# Patient Record
Sex: Female | Born: 1935
Health system: Southern US, Community
[De-identification: ages and names within clinical notes are randomized; demographics above are authoritative.]

## PROBLEM LIST (undated history)

## (undated) DIAGNOSIS — I10 Essential (primary) hypertension: Secondary | ICD-10-CM

## (undated) HISTORY — PX: APPENDECTOMY: SHX54

## (undated) HISTORY — PX: TONSILLECTOMY: SUR1361

## (undated) HISTORY — PX: TUBAL LIGATION: SHX77

## (undated) HISTORY — DX: Essential (primary) hypertension: I10

---

## 2005-02-22 ENCOUNTER — Ambulatory Visit: Payer: Self-pay | Admitting: Internal Medicine

## 2006-04-11 ENCOUNTER — Ambulatory Visit: Payer: Self-pay | Admitting: Internal Medicine

## 2007-04-30 ENCOUNTER — Ambulatory Visit: Payer: Self-pay | Admitting: Unknown Physician Specialty

## 2007-05-08 ENCOUNTER — Ambulatory Visit: Payer: Self-pay | Admitting: Internal Medicine

## 2008-05-11 ENCOUNTER — Ambulatory Visit: Payer: Self-pay | Admitting: Internal Medicine

## 2009-05-13 ENCOUNTER — Ambulatory Visit: Payer: Self-pay | Admitting: Internal Medicine

## 2010-05-24 ENCOUNTER — Ambulatory Visit: Payer: Self-pay | Admitting: Internal Medicine

## 2011-07-09 ENCOUNTER — Ambulatory Visit: Payer: Self-pay | Admitting: Internal Medicine

## 2012-07-24 ENCOUNTER — Ambulatory Visit: Payer: Self-pay | Admitting: Internal Medicine

## 2012-08-22 ENCOUNTER — Ambulatory Visit: Payer: Self-pay | Admitting: Unknown Physician Specialty

## 2012-12-31 HISTORY — PX: REPLACEMENT TOTAL KNEE: SUR1224

## 2013-08-11 ENCOUNTER — Ambulatory Visit: Payer: Self-pay | Admitting: Internal Medicine

## 2013-10-14 ENCOUNTER — Ambulatory Visit: Payer: Self-pay | Admitting: General Practice

## 2013-10-14 LAB — URINALYSIS, COMPLETE
Glucose,UR: NEGATIVE mg/dL (ref 0–75)
Hyaline Cast: 9
Nitrite: NEGATIVE
Ph: 5 (ref 4.5–8.0)
Protein: NEGATIVE
RBC,UR: 3 /HPF (ref 0–5)
Squamous Epithelial: 2
WBC UR: 40 /HPF (ref 0–5)

## 2013-10-14 LAB — SEDIMENTATION RATE: Erythrocyte Sed Rate: 17 mm/hr (ref 0–30)

## 2013-10-14 LAB — BASIC METABOLIC PANEL
Anion Gap: 4 — ABNORMAL LOW (ref 7–16)
EGFR (Non-African Amer.): 38 — ABNORMAL LOW
Glucose: 96 mg/dL (ref 65–99)
Sodium: 140 mmol/L (ref 136–145)

## 2013-10-14 LAB — CBC
MCH: 28.7 pg (ref 26.0–34.0)
MCHC: 33.5 g/dL (ref 32.0–36.0)
MCV: 86 fL (ref 80–100)
RBC: 4.34 10*6/uL (ref 3.80–5.20)
RDW: 14.3 % (ref 11.5–14.5)

## 2013-10-14 LAB — APTT: Activated PTT: 26.9 secs (ref 23.6–35.9)

## 2013-10-14 LAB — PROTIME-INR: INR: 0.9

## 2013-10-28 ENCOUNTER — Inpatient Hospital Stay: Payer: Self-pay | Admitting: General Practice

## 2013-10-29 LAB — BASIC METABOLIC PANEL
BUN: 25 mg/dL — ABNORMAL HIGH (ref 7–18)
Calcium, Total: 8.7 mg/dL (ref 8.5–10.1)
Chloride: 105 mmol/L (ref 98–107)
Co2: 30 mmol/L (ref 21–32)
EGFR (Non-African Amer.): 46 — ABNORMAL LOW
Osmolality: 280 (ref 275–301)
Potassium: 4.4 mmol/L (ref 3.5–5.1)

## 2013-10-29 LAB — HEMOGLOBIN: HGB: 10.8 g/dL — ABNORMAL LOW (ref 12.0–16.0)

## 2013-10-29 LAB — PLATELET COUNT: Platelet: 165 10*3/uL (ref 150–440)

## 2013-10-30 LAB — HEMOGLOBIN: HGB: 10.6 g/dL — ABNORMAL LOW (ref 12.0–16.0)

## 2013-10-30 LAB — BASIC METABOLIC PANEL
Anion Gap: 3 — ABNORMAL LOW (ref 7–16)
BUN: 26 mg/dL — ABNORMAL HIGH (ref 7–18)
Chloride: 108 mmol/L — ABNORMAL HIGH (ref 98–107)
Co2: 29 mmol/L (ref 21–32)
Creatinine: 1.28 mg/dL (ref 0.60–1.30)
EGFR (African American): 47 — ABNORMAL LOW
EGFR (Non-African Amer.): 41 — ABNORMAL LOW
Osmolality: 284 (ref 275–301)
Potassium: 4 mmol/L (ref 3.5–5.1)
Sodium: 140 mmol/L (ref 136–145)

## 2013-10-30 LAB — PLATELET COUNT: Platelet: 174 10*3/uL (ref 150–440)

## 2014-01-25 ENCOUNTER — Ambulatory Visit: Payer: Self-pay | Admitting: General Practice

## 2014-08-30 ENCOUNTER — Ambulatory Visit: Payer: Self-pay | Admitting: Internal Medicine

## 2015-04-22 NOTE — Op Note (Signed)
PATIENT NAME:  Kristen Jackson, Kristen Jackson MR#:  [id number] DATE OF BIRTH:  05-Sep-1935  DATE OF PROCEDURE:  10/28/2013  PREOPERATIVE DIAGNOSIS: Degenerative arthrosis of the left knee.   POSTOPERATIVE DIAGNOSIS: Degenerative arthrosis of the left knee.   PROCEDURE PERFORMED: Left total knee arthroplasty using computer-assisted navigation.   SURGEON: Francesco SorJames  Hooten, M.D.   ASSISTANT:  Van ClinesJon Wolfe, PA (required to maintain retraction throughout the procedure).   ANESTHESIA: Spinal.   ESTIMATED BLOOD LOSS: 50 mL.   FLUIDS REPLACED: 2000 mL of crystalloid.   TOURNIQUET TIME: 88 minutes.   DRAINS: Two medium drains to reinfusion system.   SOFT TISSUE RELEASES: Anterior cruciate ligament, posterior cruciate ligament, deep medial collateral ligament and patellofemoral ligament.   IMPLANTS UTILIZED: DePuy PFC Sigma size 2.5 posterior stabilized femoral component (cemented), size 2.5 MBT tibial component (cemented), 32 mm 3 peg oval dome patella (cemented), and a 10 mm stabilized rotating platform polyethylene insert. Gentamicin bone cement was utilized.   INDICATIONS FOR SURGERY: The patient is a 80 year old obese female who has been seen for complaints of progressive left knee pain. X-rays demonstrated severe degenerative changes in tricompartmental fashion with relative varus deformity. After discussion of the risks and benefits of surgical intervention, the patient expressed understanding of the risks and benefits and agreed with plans for surgical intervention.   PROCEDURE IN DETAIL: The patient was brought to the operating room and, after adequate spinal anesthesia was achieved, a tourniquet was placed on the patient's upper left thigh. The patient's left knee and leg were cleaned and prepped with alcohol and DuraPrep and draped in the usual sterile fashion. A "timeout" was performed as per usual protocol. The left lower extremity was exsanguinated using an Esmarch, and the tourniquet was inflated to 300  mmHg. An anterior longitudinal incision was made followed by a standard mid vastus approach. The patella was subluxed laterally and the patellofemoral ligament was incised. Deep fibers of the medial collateral ligament were elevated in a subperiosteal fashion off the medial  flare of the tibia so as to maintain a continuous soft tissue sleeve. Inspection of the knee demonstrated severe degenerative changes in a tricompartmental fashion with most significant changes noted to the medial compartment. Prominent osteophytes were debrided using a rongeur. Anterior and posterior cruciate ligaments were excised. Two 4.0 mm Schanz pins were inserted into the femur and into the tibia for attachment of the ray of trackers used for computer-assisted navigation. Hip center was identified using a circumduction technique. Distal landmarks were mapped using the computer. The distal femur and proximal tibia were mapped using the computer. Distal femoral cutting guide was positioned using computer-assisted navigation so as to achieve a 5 degrees distal valgus cut. Cut was performed and verified using the computer. Distal femur was sized and it was felt that a size 2.5 femoral component was appropriate. A size 2.5 cutting guide was positioned and the anterior cut was performed and verified using the computer. This was followed by completion of the posterior and chamfer cuts. Femoral cutting guide for the central box was then positioned and the central box cut was performed. Attention was then directed to the proximal tibia. Medial and lateral menisci were excised. The extramedullary tibial cutting guide was positioned using computer-assisted navigation so as to achieve a 0 degrees varus valgus alignment and 0 degrees posterior slope. Cut was performed and verified using the computer. The proximal tibia was sized and it was felt that a size 2.5 tibial tray was appropriate. Tibial and femoral trials  were inserted followed by insertion  of a 10 mm polyethylene insert. Good medial and lateral soft tissue balance was appreciated both in full extension and in flexion. Finally, the patella was cut and prepared so as to accommodate a 32 mm 3 peg oval dome patella. Patellar trial was placed and the knee was placed through a range of motion with excellent patellar tracking appreciated.   The femoral trial was removed. Central post hole for the tibial component was reamed followed by insertion of a keel punch. Tibial trial was then removed. Cut surfaces of bone were irrigated with copious amounts of normal saline with antibiotic solution using pulsatile lavage and then suctioned dry. Polymethyl methacrylate cement with gentamicin was prepared in the usual fashion using a vacuum mixer. Cement was applied to the cut surface of the proximal tibia as well as along the undersurface of a size 2.5 MBT tibial component. The tibial component was positioned and impacted into place. Excess cement was removed using Personal assistant. Cement was then applied to the cut surface of the femur as well as on the posterior flanges of a size 2.5 posterior stabilized femoral component. Femoral component was positioned and impacted into place. Excess cement was removed using Personal assistant. A 10 mm polyethylene trial was inserted and the knee was brought in full extension with steady axial compression applied. Finally, cement was applied to the backside of a 32 mm 3 peg oval dome patella, and the patellar component was positioned and patellar clamp applied. Excess cement was removed using Personal assistant.   After adequate curing of cement, the tourniquet was deflated after total tourniquet time of 88 minutes. Hemostasis was achieved using electrocautery. The knee was irrigated with copious amounts of normal saline with antibiotic solution using pulsatile lavage and then suctioned dry. The knee was inspected for any residual cement debris. 20 mL of 1.3% Exparel in 40 mL of  normal saline was injected along the posterior recess, medial and lateral gutters, and along the arthrotomy site. A 10 mm stabilized rotating platform polyethylene insert was inserted and the knee was placed through a range of motion with excellent patellar tracking appreciated and excellent mediolateral soft tissue balancing noted. Two medium drains were placed in the wound bed and brought out through a separate stab incision to be attached to a reinfusion system. The medial parapatellar portion of the incision was reapproximated using interrupted sutures of #1 Vicryl. The subcutaneous tissue was approximated in layers using first running subcuticular suture of #0 Vicryl followed by interrupted sutures of #0 Vicryl. Final layer was closed using interrupted sutures of 2-0 Vicryl. 30 mL of 0.25% Marcaine with epinephrine was injected along the incision site into the subcutaneous tissue. Skin was then closed with skin staples. A sterile dressing was applied followed by application of Polar Care.   The patient tolerated the procedure well. She was transported to the recovery room in stable condition.   ____________________________ Illene Labrador. Angie Fava., MD jph:dp D: 10/28/2013 11:24:32 ET T: 10/28/2013 11:38:40 ET JOB#: [id number]  cc: Fayrene Fearing P. Angie Fava., MD, <Dictator> JAMES P Angie Fava MD ELECTRONICALLY SIGNED 11/01/2013 11:09

## 2015-04-22 NOTE — Discharge Summary (Signed)
PATIENT NAME:  Kristen Jackson, Jaki C MR#:  [id number] DATE OF BIRTH:  [birth year]  DATE OF ADMISSION:  10/28/2013 DATE OF DISCHARGE:  10/31/2013  ADMITTING DIAGNOSIS: Degenerative arthrosis of the left knee.   DISCHARGE DIAGNOSIS: Degenerative arthrosis of the right knee.   HISTORY OF PRESENT ILLNESS: The patient is a pleasant 80 year old female who has [facility] for progression of left knee pain. She has reported somewhere around a 2 to 3 year history of left knee pain. She had appreciated some increase in her left knee pain recently. Her pain was noted to be aggravated with weight-bearing activities. She had localized most of the pain along the medial aspect of the knee. She had significant start-up stiffness as well as difficulty with arising from a sitting position or going up and down stairs. She reported some nighttime discomfort. The patient had not seen any significant improvement in her condition despite nonsteroidal anti-inflammatory medications as well as intra-articular cortisone injections. She states that the pain had progressed to the point that it was significantly interfering with her activities of daily living. [facility] showed narrowing of the medial cartilage space with associated varus alignment. She was noted to have osteophyte as well as subchondral sclerosis. Degenerative changes to the patellofemoral articulation were noted. After discussion of the risks and benefits of surgical intervention, the patient expressed her understanding of the risks and benefits and agreed for plans for surgical intervention.   PROCEDURE: Left total knee arthroplasty using computer-assisted navigation.   ANESTHESIA: Spinal.   SOFT TISSUE RELEASE: Anterior cruciate ligament, posterior cruciate ligament, deep medial collateral ligaments, as well as patellofemoral ligament.   IMPLANTS UTILIZED:  DePuy PFC Sigma size 2.5 posterior stabilized femoral component  (cemented), size 2.5 MBT tibial component (cemented), 32 mm three pegged oval dome patella (cemented), and a 10 mm stabilized rotating platform polyethylene insert. Gentamicin bone cement was utilized.   HOSPITAL COURSE: The patient tolerated the procedure very well. She had no complications. She was then taken to the PAC-U where she was stabilized and then transferred to the orthopedic floor. She began receiving anticoagulation therapy of Lovenox 30 mg subcutaneous every 12 hours per anesthesia and pharmacy protocol. She was fitted with TED stockings bilaterally. These were allowed to be removed 1 hour per 8 hour shift. The left one was applied on day 2 following removal of the Hemovac and dressing change. The patient was also fitted with the AV-I compression foot pumps bilaterally set at 80 mmHg. Her calves have been nontender. There has been no evidence of any DVTs. Heels were elevated off the bed using rolled towels. Negative Homans sign.   The patient has denied any chest pain or shortness of breath. Vital signs have been stable. She has been afebrile. Hemodynamically she was stable. No transfusions were given other than the Autovac transfusions given the first 6 hours postoperatively.   Physical therapy was initiated on day 1 for gait training and transfers. She has done very well. Upon being discharged, she was ambulating greater than 200 feet. She was able go up and down 4 sets of steps. She was independent with bed to chair transfers. Occupational therapy was also initiated on day 1 for activities of daily living and assistive devices. Overall it has been an unremarkable hospital course and she has done very well.   The patient's IV, Foley and Hemovac were discontinued on day 2 along with a dressing change. The wound was free of any drainage or  signs of infection. Polar Care was reapplied to the surgical leg, maintaining a temperature of 40 to 50 degrees Fahrenheit.   DISPOSITION: The patient is  being discharged to home in improved stable condition.   DISCHARGE INSTRUCTIONS:  She will continue weight-bearing as tolerated. Continue using a rolling walker until cleared by physical therapy to go to a quad cane. She will receive home health patient. Continue with TED stockings bilaterally. These are allowed to be removed at night but are to be worn during the day. Continue with Polar Care, maintaining temperature of 40 to 50 degrees Fahrenheit. Recommend that she wear this around-the-clock 24 hours a day until seen in the office in 2 weeks. Elevate the heels off the bed. Recommend that she continue with her incentive spirometer q.1 hour while awake and encourage her to do cough, deep breathing every 2 hours while awake. She is placed on a regular diet. She was instructed on wound care.  She is not to get the wound wet until staples are removed in 2 weeks. She is to call the clinic if any fevers of 101.5 or greater or excessive bleeding. She has a follow-up appointment on 11/13 at 8:30.  DRUG ALLERGIES:  No known drug allergies.   MEDICATIONS:  The patient was given a prescription for oxycodone 5 to 10 mg every 4 to 6 hours p.r.n. for pain, Ultram 50 to 100 mg every 4 to 6 hours p.r.n. for pain and Lovenox 40 mg subcutaneously daily for 14 days and then discontinue and begin taking one 81 mg enteric-coated aspirin per day unless contraindicated.   PAST MEDICAL HISTORY:  Hypertension, adenoma, chronic anemia.     ____________________________ Van Clines, PA jrw:dp D: 11/03/2013 07:49:46 ET T: 11/03/2013 08:18:21 ET JOB#: [id number]  cc: Van Clines, PA, <Dictator> Simar Pothier PA ELECTRONICALLY SIGNED 11/04/2013 7:59

## 2015-08-12 ENCOUNTER — Other Ambulatory Visit: Payer: Self-pay | Admitting: Internal Medicine

## 2015-08-12 DIAGNOSIS — Z1231 Encounter for screening mammogram for malignant neoplasm of breast: Secondary | ICD-10-CM

## 2015-09-01 ENCOUNTER — Ambulatory Visit
Admission: RE | Admit: 2015-09-01 | Discharge: 2015-09-01 | Disposition: A | Discharge status: Home / Self Care | Payer: PPO | Source: Ambulatory Visit | Attending: Internal Medicine | Admitting: Internal Medicine

## 2015-09-01 DIAGNOSIS — Z1231 Encounter for screening mammogram for malignant neoplasm of breast: Secondary | ICD-10-CM | POA: Diagnosis present

## 2016-02-08 DIAGNOSIS — Z0000 Encounter for general adult medical examination without abnormal findings: Secondary | ICD-10-CM | POA: Diagnosis not present

## 2016-02-15 DIAGNOSIS — E039 Hypothyroidism, unspecified: Secondary | ICD-10-CM | POA: Diagnosis not present

## 2016-02-15 DIAGNOSIS — Z79899 Other long term (current) drug therapy: Secondary | ICD-10-CM | POA: Diagnosis not present

## 2016-02-15 DIAGNOSIS — I10 Essential (primary) hypertension: Secondary | ICD-10-CM | POA: Diagnosis not present

## 2016-08-08 DIAGNOSIS — Z79899 Other long term (current) drug therapy: Secondary | ICD-10-CM | POA: Diagnosis not present

## 2016-08-08 DIAGNOSIS — E039 Hypothyroidism, unspecified: Secondary | ICD-10-CM | POA: Diagnosis not present

## 2016-08-15 DIAGNOSIS — Z0000 Encounter for general adult medical examination without abnormal findings: Secondary | ICD-10-CM | POA: Diagnosis not present

## 2016-08-15 DIAGNOSIS — E538 Deficiency of other specified B group vitamins: Secondary | ICD-10-CM | POA: Diagnosis not present

## 2016-08-21 ENCOUNTER — Other Ambulatory Visit: Payer: Self-pay | Admitting: Internal Medicine

## 2016-08-21 DIAGNOSIS — Z1231 Encounter for screening mammogram for malignant neoplasm of breast: Secondary | ICD-10-CM

## 2016-09-06 ENCOUNTER — Ambulatory Visit: Payer: PPO

## 2016-09-17 DIAGNOSIS — H353131 Nonexudative age-related macular degeneration, bilateral, early dry stage: Secondary | ICD-10-CM | POA: Diagnosis not present

## 2016-09-17 DIAGNOSIS — H40019 Open angle with borderline findings, low risk, unspecified eye: Secondary | ICD-10-CM | POA: Diagnosis not present

## 2016-09-27 ENCOUNTER — Other Ambulatory Visit: Payer: Self-pay | Admitting: Internal Medicine

## 2016-09-27 ENCOUNTER — Ambulatory Visit
Admission: RE | Admit: 2016-09-27 | Discharge: 2016-09-27 | Disposition: A | Discharge status: Home / Self Care | Payer: PPO | Source: Ambulatory Visit | Attending: Internal Medicine | Admitting: Internal Medicine

## 2016-09-27 DIAGNOSIS — Z1231 Encounter for screening mammogram for malignant neoplasm of breast: Secondary | ICD-10-CM

## 2017-02-11 DIAGNOSIS — E538 Deficiency of other specified B group vitamins: Secondary | ICD-10-CM | POA: Diagnosis not present

## 2017-02-11 DIAGNOSIS — Z0000 Encounter for general adult medical examination without abnormal findings: Secondary | ICD-10-CM | POA: Diagnosis not present

## 2017-02-18 DIAGNOSIS — Z0000 Encounter for general adult medical examination without abnormal findings: Secondary | ICD-10-CM | POA: Diagnosis not present

## 2017-02-18 DIAGNOSIS — R32 Unspecified urinary incontinence: Secondary | ICD-10-CM | POA: Diagnosis not present

## 2017-02-18 DIAGNOSIS — I10 Essential (primary) hypertension: Secondary | ICD-10-CM | POA: Diagnosis not present

## 2017-02-18 DIAGNOSIS — E538 Deficiency of other specified B group vitamins: Secondary | ICD-10-CM | POA: Diagnosis not present

## 2017-03-04 ENCOUNTER — Encounter: Payer: Self-pay | Admitting: Urology

## 2017-03-04 ENCOUNTER — Ambulatory Visit: Payer: PPO | Admitting: Urology

## 2017-03-04 VITALS — BP 168/72 | HR 52 | Ht 62.0 in | Wt 185.5 lb

## 2017-03-04 DIAGNOSIS — R32 Unspecified urinary incontinence: Secondary | ICD-10-CM | POA: Diagnosis not present

## 2017-03-04 DIAGNOSIS — N3946 Mixed incontinence: Secondary | ICD-10-CM

## 2017-03-04 LAB — BLADDER SCAN AMB NON-IMAGING: Scan Result: 293

## 2017-03-04 NOTE — Progress Notes (Signed)
03/04/2017 9:13 AM   Kristen Jackson 03/03/1935 [id number]  Referring provider: Danella PentonMark F Miller, MD [phone] [facility] [place], [place] [id number]  Chief Complaint  Patient presents with  . New Patient (Initial Visit)    urination incontinence, possible bladder prolapse     HPI: I was consulted to assess the patient's urinary incontinence and prolapse. For many months she has worsening incontinence. She describes urgency incontinence especially when she goes from a sitting to standing position. She may at times leak with coughing sneezing bending and lifting but other times does not. She does not have bedwetting. She wears 1-3 pads per day and sometimes they can be soaken  She feels vaginal bulging especially with a bowel movement. She will reduce the prolapse to try to empty better and to avoid post voiding incontinence.  She voids every 2-3 hours and has no nocturia. She reports are reasonable to good flow  She has not had a hysterectomy. Stool softeners regulate her bowel movements  She denies a history kidney stones previous GU surgery and recent urinary tract infections. She has no neurologic issues.  Modifying factors: There are no other modifying factors  Associated signs and symptoms: There are no other associated signs and symptoms Aggravating and relieving factors: There are no other aggravating or relieving factors Severity: Moderate Duration: Persistent     PMH: Past Medical History:  Diagnosis Date  . Hypertension     Surgical History: Past Surgical History:  Procedure Laterality Date  . APPENDECTOMY    . REPLACEMENT TOTAL KNEE  2014  . TONSILLECTOMY    . TUBAL LIGATION      Home Medications:  Allergies as of 03/04/2017   No Known Allergies     Medication List       Accurate as of 03/04/17  9:13 AM. Always use your most recent med list.          atenolol 100 MG tablet Commonly known as:  TENORMIN Take 100 mg by  mouth daily.   cyanocobalamin 2000 MCG tablet Take 2,000 mcg by mouth daily.   lisinopril-hydrochlorothiazide 20-25 MG tablet Commonly known as:  PRINZIDE,ZESTORETIC Take 1 tablet by mouth daily.   Lutein 15-0.7 MG Caps Take by mouth.   OMEGA-3 1450 PO Take by mouth.   traMADol 50 MG tablet Commonly known as:  ULTRAM Take 50 mg by mouth every 6 (six) hours as needed.   Vitamin D3 2000 units Tabs Take by mouth.       Allergies: No Known Allergies  Family History: Family History  Problem Relation Age of Onset  . Breast cancer Sister [id number]  . Hematuria Neg Hx   . Renal cancer Neg Hx     Social History:  has no tobacco, alcohol, and drug history on file.  ROS: UROLOGY Frequent Urination?: No Hard to postpone urination?: Yes Burning/pain with urination?: No Get up at night to urinate?: No Leakage of urine?: No Urine stream starts and stops?: No Trouble starting stream?: No Do you have to strain to urinate?: No Blood in urine?: No Urinary tract infection?: No Sexually transmitted disease?: No Injury to kidneys or bladder?: No Painful intercourse?: No Weak stream?: No Currently pregnant?: No Vaginal bleeding?: No Last menstrual period?: No  Gastrointestinal Nausea?: No Vomiting?: No Indigestion/heartburn?: No Diarrhea?: No Constipation?: No  Constitutional Fever: No Night sweats?: Yes Weight loss?: No Fatigue?: No  Skin Skin rash/lesions?: No Itching?: No  Eyes Blurred vision?: No Double vision?: No  Ears/Nose/Throat Sore throat?: No Sinus problems?: No  Hematologic/Lymphatic Swollen glands?: No Easy bruising?: No  Cardiovascular Leg swelling?: Yes Chest pain?: No  Respiratory Cough?: No Shortness of breath?: No  Endocrine Excessive thirst?: No  Musculoskeletal Back pain?: No Joint pain?: No  Neurological Headaches?: No Dizziness?: No  Psychologic Depression?: No Anxiety?: No  Physical Exam: BP (!) 168/72   Pulse (!)  52   Ht 5\' 2"  (1.575 m)   Wt 84.1 kg (185 lb 8 oz)   BMI 33.93 kg/m   Constitutional:  Alert and oriented, No acute distress. HEENT: [place] AT, moist mucus membranes.  Trachea midline, no masses. Cardiovascular: No clubbing, cyanosis, or edema. Respiratory: Normal respiratory effort, no increased work of breathing. GI: Abdomen is soft, nontender, nondistended, no abdominal masses GU: On pelvic examination the patient had a grade 3 cystocele with central defect. The uterus descended from 8 or 9 cm to approximately 4 cm. She had a diffuse grade 2 rectocele. She had hypermobility of the bladder neck and a negative cough test but could not cough hard. Skin: No rashes, bruises or suspicious lesions. Lymph: No cervical or inguinal adenopathy. Neurologic: Grossly intact, no focal deficits, moving all 4 extremities. Psychiatric: Normal mood and affect.  Laboratory Data: Lab Results  Component Value Date   WBC 7.5 10/14/2013   HGB 10.6 (L) 10/30/2013   HCT 37.2 10/14/2013   MCV 86 10/14/2013   PLT 174 10/30/2013    Lab Results  Component Value Date   CREATININE 1.28 10/30/2013    No results found for: PSA  No results found for: TESTOSTERONE  No results found for: HGBA1C  Urinalysis    Component Value Date/Time   COLORURINE Yellow 10/14/2013 0924   APPEARANCEUR Clear 10/14/2013 0924   LABSPEC 1.008 10/14/2013 0924   PHURINE 5.0 10/14/2013 0924   GLUCOSEU Negative 10/14/2013 0924   HGBUR Negative 10/14/2013 0924   BILIRUBINUR Negative 10/14/2013 0924   KETONESUR Negative 10/14/2013 0924   PROTEINUR Negative 10/14/2013 0924   NITRITE Negative 10/14/2013 0924   LEUKOCYTESUR 2+ 10/14/2013 0924    Pertinent Imaging: none  Assessment & Plan:  The patient has mixed incontinence but primarily urgency incontinence. She has symptomatic prolapse. The patient has significant prolapse with cystocele and rectocele and loss of uterine support. If she ever had surgery she likely would best  benefit from a transvaginal hysterectomy with vault suspension and cystocele repair and graft. She probably would benefit from a rectocele repair but based upon her age she may or may not need one for certain. The role of a pessary and urodynamics was discussed and a picture was on. The patient she chose watchful waiting on all accounts especially based upon her age and relative comorbidities she will let me know and I will order urodynamics as she changes her mind   1. Urinary incontinence in female 2. Urgency incontinence 3. Cystocele 4. Rectocele  - Urinalysis, Complete - Bladder Scan (Post Void Residual) in office   No Follow-up on file.  Martina Sinner, MD  [facility] [address], [address] [place], [place] [address] [phone]

## 2017-03-04 NOTE — Addendum Note (Signed)
Addended by: Lonna CobbUSSELL, Kayd Launer L on: 03/04/2017 09:38 AM   Modules accepted: Orders

## 2017-04-19 DIAGNOSIS — E538 Deficiency of other specified B group vitamins: Secondary | ICD-10-CM | POA: Diagnosis not present

## 2017-08-20 DIAGNOSIS — I10 Essential (primary) hypertension: Secondary | ICD-10-CM | POA: Diagnosis not present

## 2017-08-20 DIAGNOSIS — E538 Deficiency of other specified B group vitamins: Secondary | ICD-10-CM | POA: Diagnosis not present

## 2017-08-27 DIAGNOSIS — E538 Deficiency of other specified B group vitamins: Secondary | ICD-10-CM | POA: Diagnosis not present

## 2017-08-27 DIAGNOSIS — M7062 Trochanteric bursitis, left hip: Secondary | ICD-10-CM | POA: Diagnosis not present

## 2017-08-27 DIAGNOSIS — E039 Hypothyroidism, unspecified: Secondary | ICD-10-CM | POA: Diagnosis not present

## 2017-08-27 DIAGNOSIS — Z0000 Encounter for general adult medical examination without abnormal findings: Secondary | ICD-10-CM | POA: Diagnosis not present

## 2017-09-18 ENCOUNTER — Other Ambulatory Visit: Payer: Self-pay | Admitting: Internal Medicine

## 2017-09-18 DIAGNOSIS — Z1231 Encounter for screening mammogram for malignant neoplasm of breast: Secondary | ICD-10-CM

## 2017-10-01 ENCOUNTER — Ambulatory Visit
Admission: RE | Admit: 2017-10-01 | Discharge: 2017-10-01 | Disposition: A | Discharge status: Home / Self Care | Payer: PPO | Source: Ambulatory Visit | Attending: Internal Medicine | Admitting: Internal Medicine

## 2017-10-01 DIAGNOSIS — Z1231 Encounter for screening mammogram for malignant neoplasm of breast: Secondary | ICD-10-CM | POA: Diagnosis not present

## 2017-10-16 DIAGNOSIS — H40019 Open angle with borderline findings, low risk, unspecified eye: Secondary | ICD-10-CM | POA: Diagnosis not present

## 2017-10-16 DIAGNOSIS — H401130 Primary open-angle glaucoma, bilateral, stage unspecified: Secondary | ICD-10-CM | POA: Diagnosis not present

## 2017-10-16 DIAGNOSIS — H353131 Nonexudative age-related macular degeneration, bilateral, early dry stage: Secondary | ICD-10-CM | POA: Diagnosis not present

## 2018-02-17 DIAGNOSIS — E538 Deficiency of other specified B group vitamins: Secondary | ICD-10-CM | POA: Diagnosis not present

## 2018-02-17 DIAGNOSIS — E039 Hypothyroidism, unspecified: Secondary | ICD-10-CM | POA: Diagnosis not present

## 2018-02-24 DIAGNOSIS — Z79899 Other long term (current) drug therapy: Secondary | ICD-10-CM | POA: Diagnosis not present

## 2018-02-24 DIAGNOSIS — Z0000 Encounter for general adult medical examination without abnormal findings: Secondary | ICD-10-CM | POA: Diagnosis not present

## 2018-02-24 DIAGNOSIS — E039 Hypothyroidism, unspecified: Secondary | ICD-10-CM | POA: Diagnosis not present

## 2018-02-24 DIAGNOSIS — E538 Deficiency of other specified B group vitamins: Secondary | ICD-10-CM | POA: Diagnosis not present

## 2018-09-17 ENCOUNTER — Other Ambulatory Visit: Payer: Self-pay | Admitting: Internal Medicine

## 2018-09-17 DIAGNOSIS — Z1231 Encounter for screening mammogram for malignant neoplasm of breast: Secondary | ICD-10-CM

## 2018-10-02 ENCOUNTER — Ambulatory Visit
Admission: RE | Admit: 2018-10-02 | Discharge: 2018-10-02 | Disposition: A | Discharge status: Home / Self Care | Payer: PPO | Source: Ambulatory Visit | Attending: Internal Medicine | Admitting: Internal Medicine

## 2018-10-02 DIAGNOSIS — Z1231 Encounter for screening mammogram for malignant neoplasm of breast: Secondary | ICD-10-CM | POA: Diagnosis not present

## 2018-10-23 DIAGNOSIS — E538 Deficiency of other specified B group vitamins: Secondary | ICD-10-CM | POA: Diagnosis not present

## 2018-10-23 DIAGNOSIS — E039 Hypothyroidism, unspecified: Secondary | ICD-10-CM | POA: Diagnosis not present

## 2018-10-23 DIAGNOSIS — Z79899 Other long term (current) drug therapy: Secondary | ICD-10-CM | POA: Diagnosis not present

## 2018-10-30 DIAGNOSIS — Z0000 Encounter for general adult medical examination without abnormal findings: Secondary | ICD-10-CM | POA: Diagnosis not present

## 2018-10-30 DIAGNOSIS — E538 Deficiency of other specified B group vitamins: Secondary | ICD-10-CM | POA: Diagnosis not present

## 2018-10-30 DIAGNOSIS — E039 Hypothyroidism, unspecified: Secondary | ICD-10-CM | POA: Diagnosis not present

## 2018-11-07 DIAGNOSIS — M5432 Sciatica, left side: Secondary | ICD-10-CM | POA: Diagnosis not present

## 2018-11-07 DIAGNOSIS — M9905 Segmental and somatic dysfunction of pelvic region: Secondary | ICD-10-CM | POA: Diagnosis not present

## 2018-11-07 DIAGNOSIS — M9903 Segmental and somatic dysfunction of lumbar region: Secondary | ICD-10-CM | POA: Diagnosis not present

## 2018-11-07 DIAGNOSIS — M4306 Spondylolysis, lumbar region: Secondary | ICD-10-CM | POA: Diagnosis not present

## 2018-11-10 DIAGNOSIS — M9905 Segmental and somatic dysfunction of pelvic region: Secondary | ICD-10-CM | POA: Diagnosis not present

## 2018-11-10 DIAGNOSIS — M5432 Sciatica, left side: Secondary | ICD-10-CM | POA: Diagnosis not present

## 2018-11-10 DIAGNOSIS — M4306 Spondylolysis, lumbar region: Secondary | ICD-10-CM | POA: Diagnosis not present

## 2018-11-10 DIAGNOSIS — M9903 Segmental and somatic dysfunction of lumbar region: Secondary | ICD-10-CM | POA: Diagnosis not present

## 2018-11-12 DIAGNOSIS — M9905 Segmental and somatic dysfunction of pelvic region: Secondary | ICD-10-CM | POA: Diagnosis not present

## 2018-11-12 DIAGNOSIS — M4306 Spondylolysis, lumbar region: Secondary | ICD-10-CM | POA: Diagnosis not present

## 2018-11-12 DIAGNOSIS — M5432 Sciatica, left side: Secondary | ICD-10-CM | POA: Diagnosis not present

## 2018-11-12 DIAGNOSIS — M9903 Segmental and somatic dysfunction of lumbar region: Secondary | ICD-10-CM | POA: Diagnosis not present

## 2018-11-14 DIAGNOSIS — M4306 Spondylolysis, lumbar region: Secondary | ICD-10-CM | POA: Diagnosis not present

## 2018-11-14 DIAGNOSIS — M9903 Segmental and somatic dysfunction of lumbar region: Secondary | ICD-10-CM | POA: Diagnosis not present

## 2018-11-14 DIAGNOSIS — M5432 Sciatica, left side: Secondary | ICD-10-CM | POA: Diagnosis not present

## 2018-11-14 DIAGNOSIS — M9905 Segmental and somatic dysfunction of pelvic region: Secondary | ICD-10-CM | POA: Diagnosis not present

## 2018-11-17 DIAGNOSIS — M9903 Segmental and somatic dysfunction of lumbar region: Secondary | ICD-10-CM | POA: Diagnosis not present

## 2018-11-17 DIAGNOSIS — M5432 Sciatica, left side: Secondary | ICD-10-CM | POA: Diagnosis not present

## 2018-11-17 DIAGNOSIS — M9905 Segmental and somatic dysfunction of pelvic region: Secondary | ICD-10-CM | POA: Diagnosis not present

## 2018-11-17 DIAGNOSIS — M4306 Spondylolysis, lumbar region: Secondary | ICD-10-CM | POA: Diagnosis not present

## 2018-11-19 DIAGNOSIS — M9905 Segmental and somatic dysfunction of pelvic region: Secondary | ICD-10-CM | POA: Diagnosis not present

## 2018-11-19 DIAGNOSIS — M9903 Segmental and somatic dysfunction of lumbar region: Secondary | ICD-10-CM | POA: Diagnosis not present

## 2018-11-19 DIAGNOSIS — M4306 Spondylolysis, lumbar region: Secondary | ICD-10-CM | POA: Diagnosis not present

## 2018-11-19 DIAGNOSIS — M5432 Sciatica, left side: Secondary | ICD-10-CM | POA: Diagnosis not present

## 2018-11-21 DIAGNOSIS — M9905 Segmental and somatic dysfunction of pelvic region: Secondary | ICD-10-CM | POA: Diagnosis not present

## 2018-11-21 DIAGNOSIS — M4306 Spondylolysis, lumbar region: Secondary | ICD-10-CM | POA: Diagnosis not present

## 2018-11-21 DIAGNOSIS — M5432 Sciatica, left side: Secondary | ICD-10-CM | POA: Diagnosis not present

## 2018-11-21 DIAGNOSIS — M9903 Segmental and somatic dysfunction of lumbar region: Secondary | ICD-10-CM | POA: Diagnosis not present

## 2018-11-24 DIAGNOSIS — M9903 Segmental and somatic dysfunction of lumbar region: Secondary | ICD-10-CM | POA: Diagnosis not present

## 2018-11-24 DIAGNOSIS — M5432 Sciatica, left side: Secondary | ICD-10-CM | POA: Diagnosis not present

## 2018-11-24 DIAGNOSIS — M4306 Spondylolysis, lumbar region: Secondary | ICD-10-CM | POA: Diagnosis not present

## 2018-11-24 DIAGNOSIS — M9905 Segmental and somatic dysfunction of pelvic region: Secondary | ICD-10-CM | POA: Diagnosis not present

## 2018-11-25 DIAGNOSIS — M9905 Segmental and somatic dysfunction of pelvic region: Secondary | ICD-10-CM | POA: Diagnosis not present

## 2018-11-25 DIAGNOSIS — M4306 Spondylolysis, lumbar region: Secondary | ICD-10-CM | POA: Diagnosis not present

## 2018-11-25 DIAGNOSIS — M9903 Segmental and somatic dysfunction of lumbar region: Secondary | ICD-10-CM | POA: Diagnosis not present

## 2018-11-25 DIAGNOSIS — M5432 Sciatica, left side: Secondary | ICD-10-CM | POA: Diagnosis not present

## 2018-11-26 DIAGNOSIS — M9903 Segmental and somatic dysfunction of lumbar region: Secondary | ICD-10-CM | POA: Diagnosis not present

## 2018-11-26 DIAGNOSIS — M5432 Sciatica, left side: Secondary | ICD-10-CM | POA: Diagnosis not present

## 2018-11-26 DIAGNOSIS — M4306 Spondylolysis, lumbar region: Secondary | ICD-10-CM | POA: Diagnosis not present

## 2018-11-26 DIAGNOSIS — M9905 Segmental and somatic dysfunction of pelvic region: Secondary | ICD-10-CM | POA: Diagnosis not present

## 2018-12-02 DIAGNOSIS — M9903 Segmental and somatic dysfunction of lumbar region: Secondary | ICD-10-CM | POA: Diagnosis not present

## 2018-12-02 DIAGNOSIS — M5432 Sciatica, left side: Secondary | ICD-10-CM | POA: Diagnosis not present

## 2018-12-02 DIAGNOSIS — M9905 Segmental and somatic dysfunction of pelvic region: Secondary | ICD-10-CM | POA: Diagnosis not present

## 2018-12-02 DIAGNOSIS — M4306 Spondylolysis, lumbar region: Secondary | ICD-10-CM | POA: Diagnosis not present

## 2018-12-03 DIAGNOSIS — M5432 Sciatica, left side: Secondary | ICD-10-CM | POA: Diagnosis not present

## 2018-12-03 DIAGNOSIS — M9905 Segmental and somatic dysfunction of pelvic region: Secondary | ICD-10-CM | POA: Diagnosis not present

## 2018-12-03 DIAGNOSIS — M9903 Segmental and somatic dysfunction of lumbar region: Secondary | ICD-10-CM | POA: Diagnosis not present

## 2018-12-03 DIAGNOSIS — M4306 Spondylolysis, lumbar region: Secondary | ICD-10-CM | POA: Diagnosis not present

## 2018-12-05 DIAGNOSIS — M4306 Spondylolysis, lumbar region: Secondary | ICD-10-CM | POA: Diagnosis not present

## 2018-12-05 DIAGNOSIS — M9905 Segmental and somatic dysfunction of pelvic region: Secondary | ICD-10-CM | POA: Diagnosis not present

## 2018-12-05 DIAGNOSIS — M5432 Sciatica, left side: Secondary | ICD-10-CM | POA: Diagnosis not present

## 2018-12-05 DIAGNOSIS — M9903 Segmental and somatic dysfunction of lumbar region: Secondary | ICD-10-CM | POA: Diagnosis not present

## 2018-12-08 DIAGNOSIS — M5432 Sciatica, left side: Secondary | ICD-10-CM | POA: Diagnosis not present

## 2018-12-08 DIAGNOSIS — M4306 Spondylolysis, lumbar region: Secondary | ICD-10-CM | POA: Diagnosis not present

## 2018-12-08 DIAGNOSIS — M9905 Segmental and somatic dysfunction of pelvic region: Secondary | ICD-10-CM | POA: Diagnosis not present

## 2018-12-08 DIAGNOSIS — M9903 Segmental and somatic dysfunction of lumbar region: Secondary | ICD-10-CM | POA: Diagnosis not present

## 2018-12-10 DIAGNOSIS — M9903 Segmental and somatic dysfunction of lumbar region: Secondary | ICD-10-CM | POA: Diagnosis not present

## 2018-12-10 DIAGNOSIS — M5432 Sciatica, left side: Secondary | ICD-10-CM | POA: Diagnosis not present

## 2018-12-10 DIAGNOSIS — M4306 Spondylolysis, lumbar region: Secondary | ICD-10-CM | POA: Diagnosis not present

## 2018-12-10 DIAGNOSIS — M9905 Segmental and somatic dysfunction of pelvic region: Secondary | ICD-10-CM | POA: Diagnosis not present

## 2018-12-15 DIAGNOSIS — M5432 Sciatica, left side: Secondary | ICD-10-CM | POA: Diagnosis not present

## 2018-12-15 DIAGNOSIS — M9903 Segmental and somatic dysfunction of lumbar region: Secondary | ICD-10-CM | POA: Diagnosis not present

## 2018-12-15 DIAGNOSIS — M4306 Spondylolysis, lumbar region: Secondary | ICD-10-CM | POA: Diagnosis not present

## 2018-12-15 DIAGNOSIS — M9905 Segmental and somatic dysfunction of pelvic region: Secondary | ICD-10-CM | POA: Diagnosis not present

## 2018-12-17 DIAGNOSIS — M9905 Segmental and somatic dysfunction of pelvic region: Secondary | ICD-10-CM | POA: Diagnosis not present

## 2018-12-17 DIAGNOSIS — M9903 Segmental and somatic dysfunction of lumbar region: Secondary | ICD-10-CM | POA: Diagnosis not present

## 2018-12-17 DIAGNOSIS — M5432 Sciatica, left side: Secondary | ICD-10-CM | POA: Diagnosis not present

## 2018-12-17 DIAGNOSIS — M4306 Spondylolysis, lumbar region: Secondary | ICD-10-CM | POA: Diagnosis not present

## 2018-12-22 DIAGNOSIS — M9905 Segmental and somatic dysfunction of pelvic region: Secondary | ICD-10-CM | POA: Diagnosis not present

## 2018-12-22 DIAGNOSIS — M5432 Sciatica, left side: Secondary | ICD-10-CM | POA: Diagnosis not present

## 2018-12-22 DIAGNOSIS — M4306 Spondylolysis, lumbar region: Secondary | ICD-10-CM | POA: Diagnosis not present

## 2018-12-22 DIAGNOSIS — M9903 Segmental and somatic dysfunction of lumbar region: Secondary | ICD-10-CM | POA: Diagnosis not present

## 2018-12-29 DIAGNOSIS — M5432 Sciatica, left side: Secondary | ICD-10-CM | POA: Diagnosis not present

## 2018-12-29 DIAGNOSIS — M9903 Segmental and somatic dysfunction of lumbar region: Secondary | ICD-10-CM | POA: Diagnosis not present

## 2018-12-29 DIAGNOSIS — M4306 Spondylolysis, lumbar region: Secondary | ICD-10-CM | POA: Diagnosis not present

## 2018-12-29 DIAGNOSIS — M9905 Segmental and somatic dysfunction of pelvic region: Secondary | ICD-10-CM | POA: Diagnosis not present

## 2019-04-28 DIAGNOSIS — E039 Hypothyroidism, unspecified: Secondary | ICD-10-CM | POA: Diagnosis not present

## 2019-04-28 DIAGNOSIS — E538 Deficiency of other specified B group vitamins: Secondary | ICD-10-CM | POA: Diagnosis not present

## 2019-05-05 DIAGNOSIS — M5136 Other intervertebral disc degeneration, lumbar region: Secondary | ICD-10-CM | POA: Diagnosis not present

## 2019-05-05 DIAGNOSIS — Z0000 Encounter for general adult medical examination without abnormal findings: Secondary | ICD-10-CM | POA: Diagnosis not present

## 2019-05-05 DIAGNOSIS — E039 Hypothyroidism, unspecified: Secondary | ICD-10-CM | POA: Diagnosis not present

## 2019-05-05 DIAGNOSIS — E538 Deficiency of other specified B group vitamins: Secondary | ICD-10-CM | POA: Diagnosis not present

## 2019-09-08 ENCOUNTER — Other Ambulatory Visit: Payer: Self-pay | Admitting: Internal Medicine

## 2019-09-08 DIAGNOSIS — Z1231 Encounter for screening mammogram for malignant neoplasm of breast: Secondary | ICD-10-CM

## 2019-10-12 ENCOUNTER — Ambulatory Visit
Admission: RE | Admit: 2019-10-12 | Discharge: 2019-10-12 | Disposition: A | Discharge status: Home / Self Care | Payer: PPO | Source: Ambulatory Visit | Attending: Internal Medicine | Admitting: Internal Medicine

## 2019-10-12 DIAGNOSIS — Z1231 Encounter for screening mammogram for malignant neoplasm of breast: Secondary | ICD-10-CM | POA: Insufficient documentation

## 2019-10-20 DIAGNOSIS — W010XXA Fall on same level from slipping, tripping and stumbling without subsequent striking against object, initial encounter: Secondary | ICD-10-CM | POA: Diagnosis not present

## 2019-10-20 DIAGNOSIS — M25512 Pain in left shoulder: Secondary | ICD-10-CM | POA: Diagnosis not present

## 2019-10-20 DIAGNOSIS — S52532A Colles' fracture of left radius, initial encounter for closed fracture: Secondary | ICD-10-CM | POA: Diagnosis not present

## 2019-10-20 DIAGNOSIS — S52502A Unspecified fracture of the lower end of left radius, initial encounter for closed fracture: Secondary | ICD-10-CM | POA: Diagnosis not present

## 2019-10-20 DIAGNOSIS — S6992XA Unspecified injury of left wrist, hand and finger(s), initial encounter: Secondary | ICD-10-CM | POA: Diagnosis not present

## 2019-10-20 DIAGNOSIS — S4992XA Unspecified injury of left shoulder and upper arm, initial encounter: Secondary | ICD-10-CM | POA: Diagnosis not present

## 2019-10-23 DIAGNOSIS — S52572A Other intraarticular fracture of lower end of left radius, initial encounter for closed fracture: Secondary | ICD-10-CM | POA: Diagnosis not present

## 2019-10-23 DIAGNOSIS — M25532 Pain in left wrist: Secondary | ICD-10-CM | POA: Diagnosis not present

## 2019-10-23 DIAGNOSIS — M25512 Pain in left shoulder: Secondary | ICD-10-CM | POA: Diagnosis not present

## 2019-10-27 DIAGNOSIS — Z6841 Body Mass Index (BMI) 40.0 and over, adult: Secondary | ICD-10-CM | POA: Diagnosis not present

## 2019-10-27 DIAGNOSIS — S52572A Other intraarticular fracture of lower end of left radius, initial encounter for closed fracture: Secondary | ICD-10-CM | POA: Diagnosis not present

## 2019-10-27 DIAGNOSIS — M25532 Pain in left wrist: Secondary | ICD-10-CM | POA: Diagnosis not present

## 2019-11-04 DIAGNOSIS — E538 Deficiency of other specified B group vitamins: Secondary | ICD-10-CM | POA: Diagnosis not present

## 2019-11-04 DIAGNOSIS — E039 Hypothyroidism, unspecified: Secondary | ICD-10-CM | POA: Diagnosis not present

## 2019-11-10 DIAGNOSIS — S52572A Other intraarticular fracture of lower end of left radius, initial encounter for closed fracture: Secondary | ICD-10-CM | POA: Diagnosis not present

## 2019-11-10 DIAGNOSIS — M25532 Pain in left wrist: Secondary | ICD-10-CM | POA: Diagnosis not present

## 2019-11-11 DIAGNOSIS — E538 Deficiency of other specified B group vitamins: Secondary | ICD-10-CM | POA: Diagnosis not present

## 2019-11-11 DIAGNOSIS — Z0000 Encounter for general adult medical examination without abnormal findings: Secondary | ICD-10-CM | POA: Diagnosis not present

## 2019-11-11 DIAGNOSIS — E039 Hypothyroidism, unspecified: Secondary | ICD-10-CM | POA: Diagnosis not present

## 2019-11-20 DIAGNOSIS — S52572A Other intraarticular fracture of lower end of left radius, initial encounter for closed fracture: Secondary | ICD-10-CM | POA: Diagnosis not present

## 2019-11-20 DIAGNOSIS — M25532 Pain in left wrist: Secondary | ICD-10-CM | POA: Diagnosis not present

## 2019-12-04 DIAGNOSIS — M25532 Pain in left wrist: Secondary | ICD-10-CM | POA: Diagnosis not present

## 2019-12-04 DIAGNOSIS — S52502D Unspecified fracture of the lower end of left radius, subsequent encounter for closed fracture with routine healing: Secondary | ICD-10-CM | POA: Diagnosis not present

## 2020-01-29 DIAGNOSIS — H353131 Nonexudative age-related macular degeneration, bilateral, early dry stage: Secondary | ICD-10-CM | POA: Diagnosis not present

## 2020-02-16 DIAGNOSIS — Z780 Asymptomatic menopausal state: Secondary | ICD-10-CM | POA: Diagnosis not present

## 2020-03-24 ENCOUNTER — Ambulatory Visit: Payer: PPO | Attending: Internal Medicine

## 2020-03-24 DIAGNOSIS — Z23 Encounter for immunization: Secondary | ICD-10-CM

## 2020-03-24 NOTE — Progress Notes (Signed)
   Covid-19 Vaccination Clinic  Name:  Kristen Jackson    MRN: [id number] DOB: 1935/01/17  03/24/2020  [name] was observed post Covid-19 immunization for 15 minutes without incident. She was provided with Vaccine Information Sheet and instruction to access the V-Safe system.   [name] was instructed to call 911 with any severe reactions post vaccine: Marland Kitchen Difficulty breathing  . Swelling of face and throat  . A fast heartbeat  . A bad rash all over body  . Dizziness and weakness   Immunizations Administered    Name Date Dose VIS Date Route   Pfizer COVID-19 Vaccine 03/24/2020  1:27 PM 0.3 mL 12/11/2019 Intramuscular   Manufacturer: [employer], [employer]   Lot: [id number]   NDC: [address]-9

## 2020-04-18 ENCOUNTER — Ambulatory Visit: Payer: PPO | Attending: Internal Medicine

## 2020-04-18 DIAGNOSIS — Z23 Encounter for immunization: Secondary | ICD-10-CM

## 2020-04-18 NOTE — Progress Notes (Signed)
   Covid-19 Vaccination Clinic  Name:  Kristen Jackson    MRN: [id number] DOB: Dec 25, 1935  04/18/2020  [name] was observed post Covid-19 immunization for 15 minutes without incident. She was provided with Vaccine Information Sheet and instruction to access the V-Safe system.   [name] was instructed to call 911 with any severe reactions post vaccine: Marland Kitchen Difficulty breathing  . Swelling of face and throat  . A fast heartbeat  . A bad rash all over body  . Dizziness and weakness   Immunizations Administered    Name Date Dose VIS Date Route   Pfizer COVID-19 Vaccine 04/18/2020 12:11 PM 0.3 mL 02/24/2019 Intramuscular   Manufacturer: [employer], [employer]   Lot: [id number]   NDC: [address]-7

## 2020-05-03 DIAGNOSIS — E538 Deficiency of other specified B group vitamins: Secondary | ICD-10-CM | POA: Diagnosis not present

## 2020-05-03 DIAGNOSIS — E039 Hypothyroidism, unspecified: Secondary | ICD-10-CM | POA: Diagnosis not present

## 2020-05-10 DIAGNOSIS — I10 Essential (primary) hypertension: Secondary | ICD-10-CM | POA: Diagnosis not present

## 2020-05-10 DIAGNOSIS — E538 Deficiency of other specified B group vitamins: Secondary | ICD-10-CM | POA: Diagnosis not present

## 2020-05-10 DIAGNOSIS — Z0000 Encounter for general adult medical examination without abnormal findings: Secondary | ICD-10-CM | POA: Diagnosis not present

## 2020-05-10 DIAGNOSIS — Z79899 Other long term (current) drug therapy: Secondary | ICD-10-CM | POA: Diagnosis not present

## 2020-05-10 DIAGNOSIS — E039 Hypothyroidism, unspecified: Secondary | ICD-10-CM | POA: Diagnosis not present

## 2020-10-24 ENCOUNTER — Other Ambulatory Visit: Payer: Self-pay | Admitting: Internal Medicine

## 2020-10-24 DIAGNOSIS — Z1231 Encounter for screening mammogram for malignant neoplasm of breast: Secondary | ICD-10-CM

## 2020-11-09 DIAGNOSIS — Z79899 Other long term (current) drug therapy: Secondary | ICD-10-CM | POA: Diagnosis not present

## 2020-11-09 DIAGNOSIS — E538 Deficiency of other specified B group vitamins: Secondary | ICD-10-CM | POA: Diagnosis not present

## 2020-11-15 ENCOUNTER — Other Ambulatory Visit: Payer: Self-pay

## 2020-11-15 ENCOUNTER — Ambulatory Visit
Admission: RE | Admit: 2020-11-15 | Discharge: 2020-11-15 | Disposition: A | Discharge status: Home / Self Care | Payer: PPO | Source: Ambulatory Visit | Attending: Internal Medicine | Admitting: Internal Medicine

## 2020-11-15 DIAGNOSIS — Z1231 Encounter for screening mammogram for malignant neoplasm of breast: Secondary | ICD-10-CM | POA: Diagnosis not present

## 2020-11-16 DIAGNOSIS — E538 Deficiency of other specified B group vitamins: Secondary | ICD-10-CM | POA: Diagnosis not present

## 2020-11-16 DIAGNOSIS — Z0000 Encounter for general adult medical examination without abnormal findings: Secondary | ICD-10-CM | POA: Diagnosis not present

## 2020-11-16 DIAGNOSIS — Z23 Encounter for immunization: Secondary | ICD-10-CM | POA: Diagnosis not present

## 2020-11-16 DIAGNOSIS — M5116 Intervertebral disc disorders with radiculopathy, lumbar region: Secondary | ICD-10-CM | POA: Diagnosis not present

## 2020-11-16 DIAGNOSIS — Z79899 Other long term (current) drug therapy: Secondary | ICD-10-CM | POA: Diagnosis not present

## 2021-01-30 IMAGING — MG DIGITAL SCREENING BILAT W/ TOMO W/ CAD
8 series · 8 of 24 positions shown · non-contrast
Comparison: Previous exam(s).

CLINICAL DATA: Screening.

EXAM:
DIGITAL SCREENING BILATERAL MAMMOGRAM WITH TOMO AND CAD

[L MLO synth-2D]
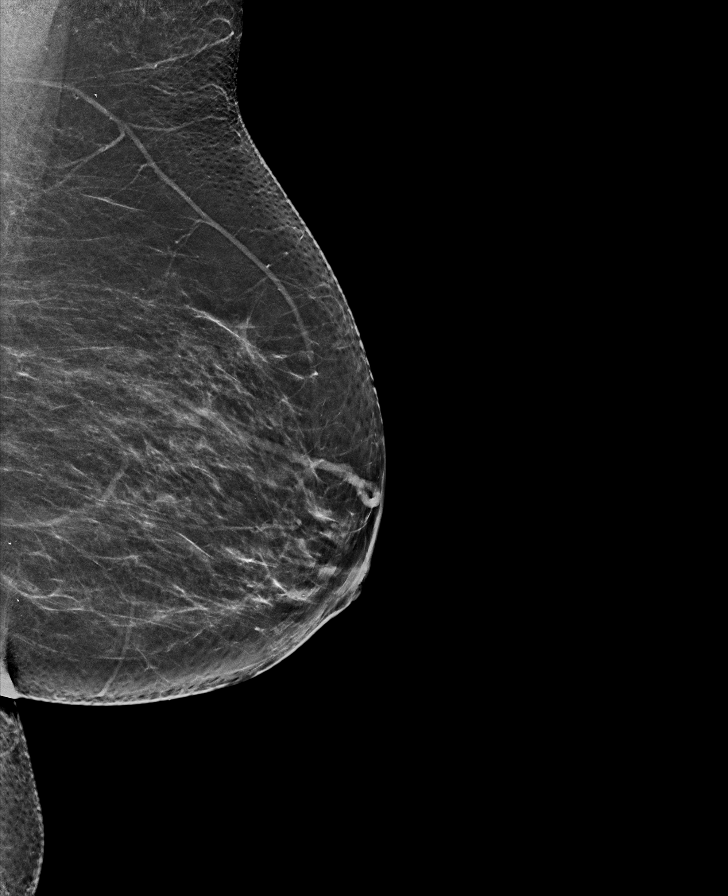

[R CC synth-2D]
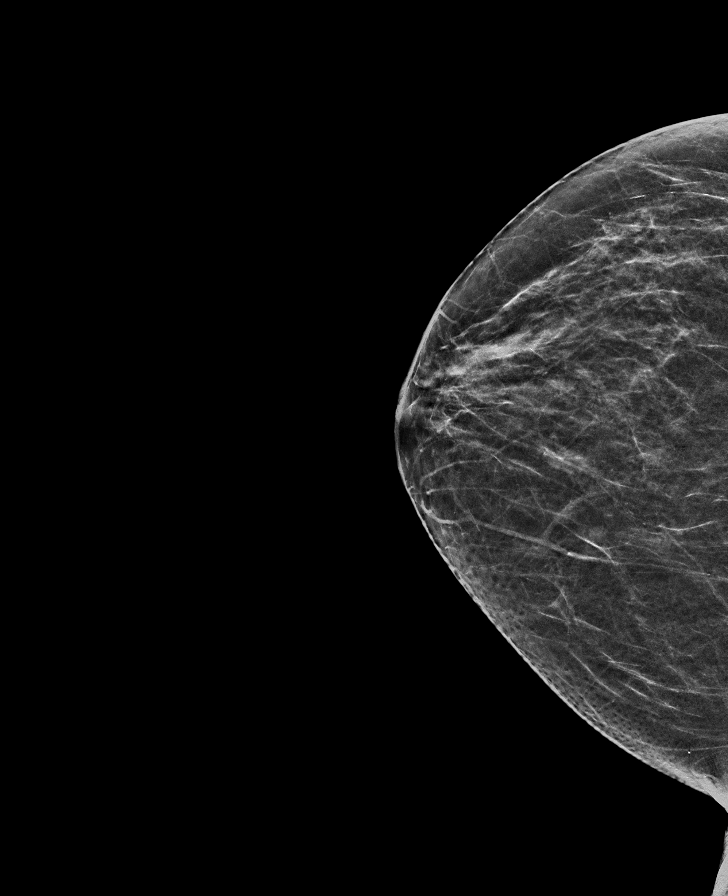

[R MLO synth-2D]
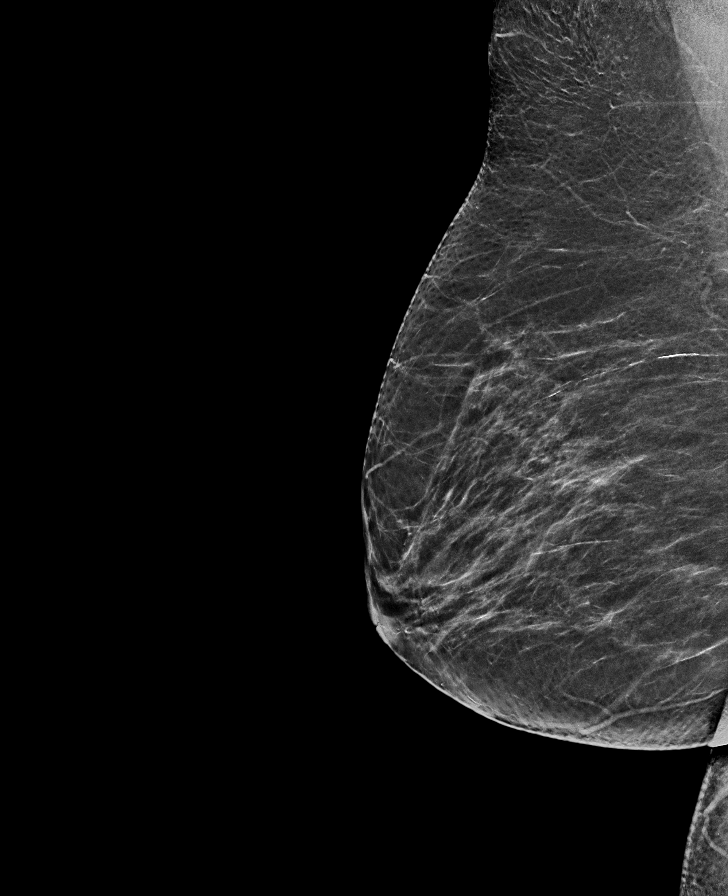

[L CC synth-2D]
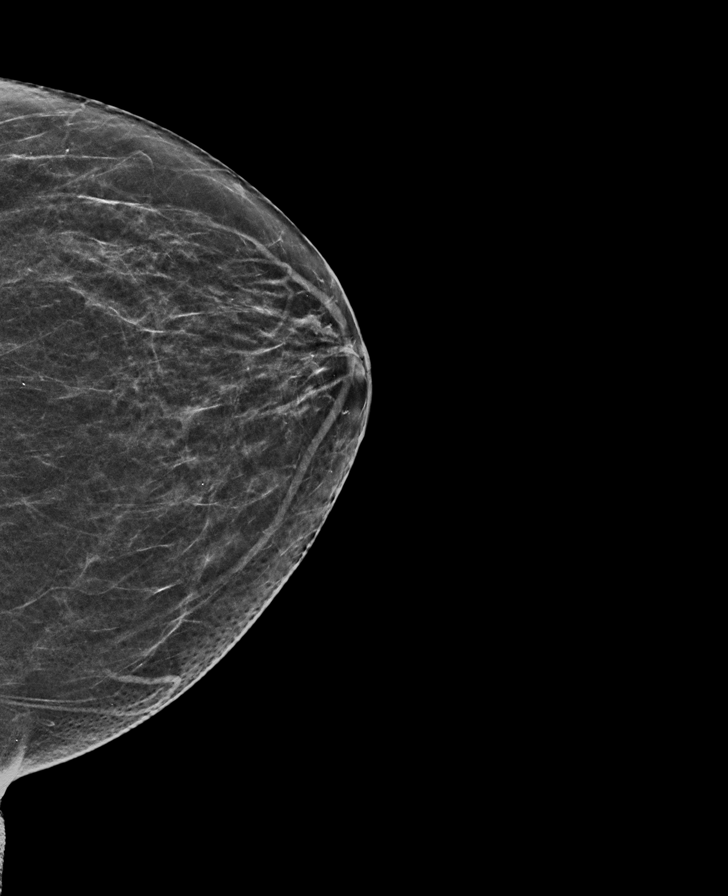

[R MLO tomo · tomo slice 31/62.0]
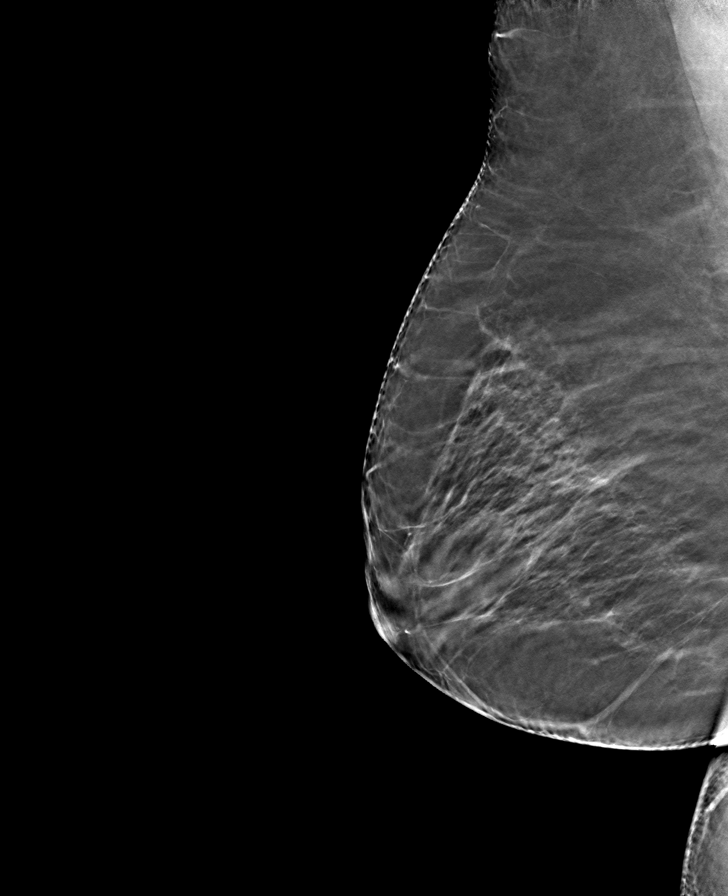

[R CC tomo · tomo slice 27/54.0]
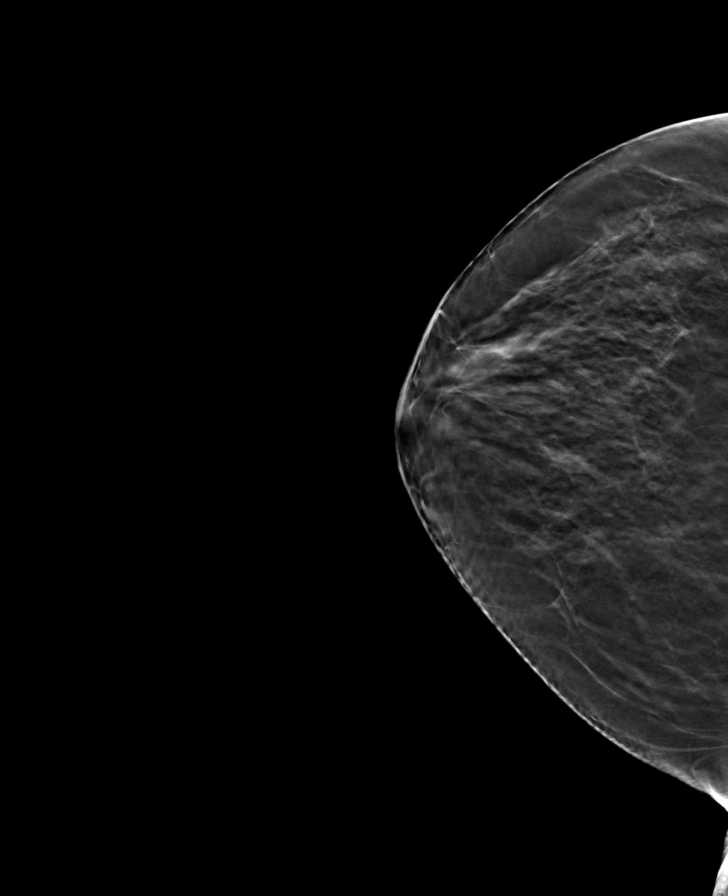

[L MLO tomo · tomo slice 31/62.0]
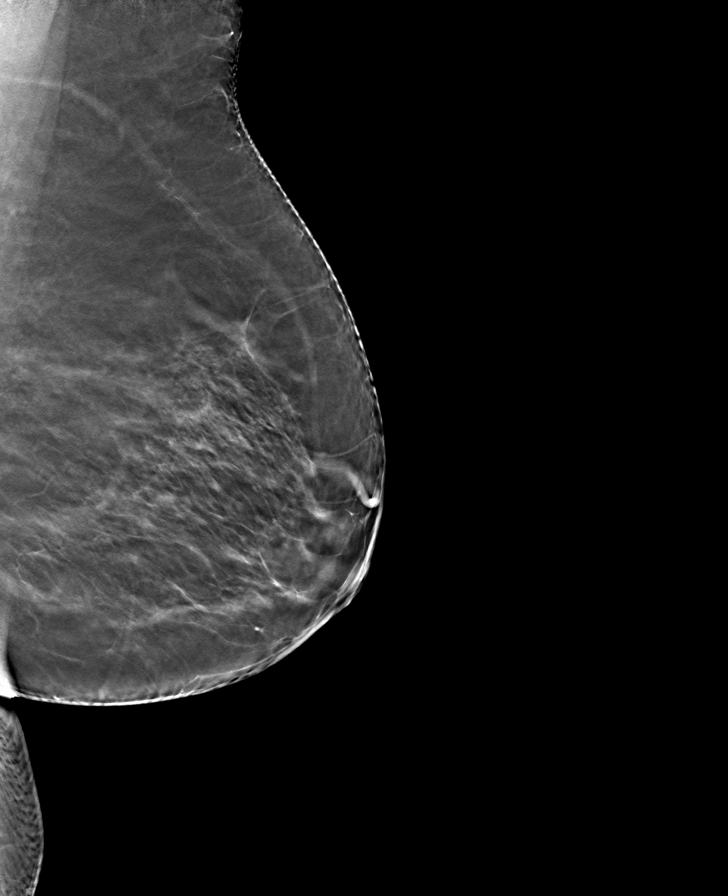

[L CC tomo · tomo slice 29/57.0]
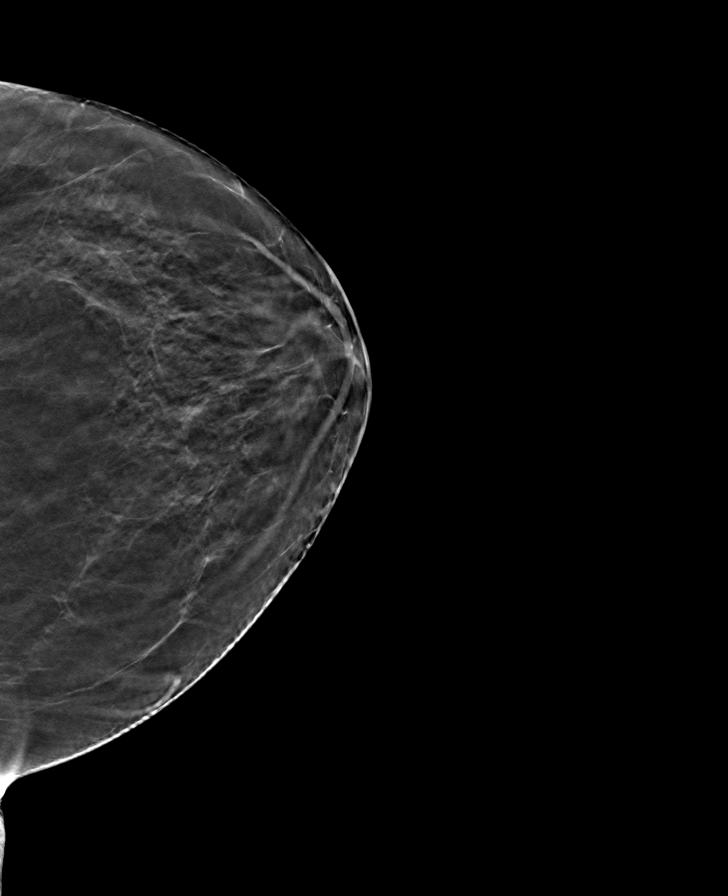

[8 of 24 positions shown; findings below may reference images not displayed]

ACR Breast Density Category b: There are scattered areas of
fibroglandular density.
FINDINGS: There are no findings suspicious for malignancy. Images were
processed with CAD.
IMPRESSION: No mammographic evidence of malignancy. A result letter of this
screening mammogram will be mailed directly to the patient.

RECOMMENDATION:
Screening mammogram in one year. (Code:[id number])

BI-RADS CATEGORY  1: Negative.

## 2021-10-12 ENCOUNTER — Other Ambulatory Visit: Payer: Self-pay | Admitting: Internal Medicine

## 2021-10-12 DIAGNOSIS — Z1231 Encounter for screening mammogram for malignant neoplasm of breast: Secondary | ICD-10-CM

## 2021-11-16 ENCOUNTER — Other Ambulatory Visit: Payer: Self-pay

## 2021-11-16 ENCOUNTER — Ambulatory Visit
Admission: RE | Admit: 2021-11-16 | Discharge: 2021-11-16 | Disposition: A | Discharge status: Home / Self Care | Payer: Medicare Other | Source: Ambulatory Visit | Attending: Internal Medicine | Admitting: Internal Medicine

## 2021-11-16 DIAGNOSIS — Z1231 Encounter for screening mammogram for malignant neoplasm of breast: Secondary | ICD-10-CM | POA: Diagnosis present

## 2022-11-07 ENCOUNTER — Other Ambulatory Visit: Payer: Self-pay | Admitting: Internal Medicine

## 2022-11-07 DIAGNOSIS — Z1231 Encounter for screening mammogram for malignant neoplasm of breast: Secondary | ICD-10-CM

## 2022-11-20 ENCOUNTER — Ambulatory Visit
Admission: RE | Admit: 2022-11-20 | Discharge: 2022-11-20 | Disposition: A | Discharge status: Home / Self Care | Payer: Medicare Other | Source: Ambulatory Visit | Attending: Internal Medicine | Admitting: Internal Medicine

## 2022-11-20 DIAGNOSIS — Z1231 Encounter for screening mammogram for malignant neoplasm of breast: Secondary | ICD-10-CM | POA: Diagnosis not present

## 2023-10-07 ENCOUNTER — Other Ambulatory Visit: Payer: Self-pay | Admitting: Internal Medicine

## 2023-10-07 DIAGNOSIS — Z1231 Encounter for screening mammogram for malignant neoplasm of breast: Secondary | ICD-10-CM

## 2023-11-22 ENCOUNTER — Ambulatory Visit
Admission: RE | Admit: 2023-11-22 | Discharge: 2023-11-22 | Disposition: A | Discharge status: Home / Self Care | Payer: Medicare Other | Source: Ambulatory Visit | Attending: Internal Medicine | Admitting: Internal Medicine

## 2023-11-22 DIAGNOSIS — Z1231 Encounter for screening mammogram for malignant neoplasm of breast: Secondary | ICD-10-CM | POA: Diagnosis present

## 2024-08-02 ENCOUNTER — Ambulatory Visit
Admission: EM | Admit: 2024-08-02 | Discharge: 2024-08-02 | Discharge status: Against Medical Advice | Attending: Physician Assistant | Admitting: Physician Assistant

## 2024-08-02 ENCOUNTER — Encounter: Payer: Self-pay | Admitting: Emergency Medicine

## 2024-08-02 DIAGNOSIS — R519 Headache, unspecified: Secondary | ICD-10-CM | POA: Diagnosis not present

## 2024-08-02 DIAGNOSIS — H9201 Otalgia, right ear: Secondary | ICD-10-CM | POA: Diagnosis not present

## 2024-08-02 DIAGNOSIS — I10 Essential (primary) hypertension: Secondary | ICD-10-CM

## 2024-08-02 DIAGNOSIS — S0991XA Unspecified injury of ear, initial encounter: Secondary | ICD-10-CM | POA: Diagnosis not present

## 2024-08-02 NOTE — ED Provider Notes (Signed)
 [facility]    CSN: [id number] Arrival date & time: 08/02/24  1317      History   Chief Complaint Chief Complaint  Patient presents with   Foreign Body in Ear    right    HPI Kristen Jackson is a 89 y.o. female presenting with her family for right ear discomfort. Patient reportedly has a piece of her hearing aid stuck in the ear. She has use a suction cup to try to remove it but believes she has been unsuccessful. Reports a mild headache (5/10). States this is a tension headache and very common for her. No dizziness, confusion, speech problems, balance issues, nausea/vomiting, numbness/weakness/tingling, chest pain or shortness of breath. BP high today at 202/82. Patient says she has not taken her BP meds today.   HPI  Past Medical History:  Diagnosis Date   Hypertension     There are no active problems to display for this patient.   Past Surgical History:  Procedure Laterality Date   APPENDECTOMY     REPLACEMENT TOTAL KNEE  2014   TONSILLECTOMY     TUBAL LIGATION      OB History   No obstetric history on file.      Home Medications    Prior to Admission medications   Medication Sig Start Date End Date Taking? Authorizing Provider  atenolol (TENORMIN) 100 MG tablet Take 100 mg by mouth daily.   Yes [provider]  Cholecalciferol (VITAMIN D3) 2000 units TABS Take by mouth.   Yes [provider]  cyanocobalamin 2000 MCG tablet Take 2,000 mcg by mouth daily.   Yes [provider]  lisinopril-hydrochlorothiazide (PRINZIDE,ZESTORETIC) 20-25 MG tablet Take 1 tablet by mouth daily.   Yes [provider]  Lutein 15-0.7 MG CAPS Take by mouth.   Yes [provider]  Omega-3 Fatty Acids (OMEGA-3 1450 PO) Take by mouth.   Yes [provider]  traMADol (ULTRAM) 50 MG tablet Take 50 mg by mouth every 6 (six) hours as needed.   Yes [provider]    Family History Family History  Problem Relation  Age of Onset   Breast cancer Sister 58   Hematuria Neg Hx    Renal cancer Neg Hx     Social History Social History   Tobacco Use   Smoking status: Never   Smokeless tobacco: Never  Vaping Use   Vaping status: Never Used  Substance Use Topics   Drug use: Not Currently     Allergies   Patient has no known allergies.   Review of Systems Review of Systems  Constitutional:  Negative for fatigue.  HENT:  Positive for ear pain. Negative for ear discharge and hearing loss.   Eyes:  Negative for photophobia and visual disturbance.  Respiratory:  Negative for shortness of breath.   Cardiovascular:  Negative for chest pain.  Gastrointestinal:  Negative for vomiting.  Musculoskeletal:  Negative for gait problem.  Neurological:  Positive for headaches. Negative for dizziness, tremors, seizures, syncope, facial asymmetry, speech difficulty, weakness, light-headedness and numbness.  Psychiatric/Behavioral:  Negative for confusion.      Physical Exam Triage Vital Signs ED Triage Vitals  Encounter Vitals Group     BP 08/02/24 1349 (!) 202/82     Girls Systolic BP Percentile --      Girls Diastolic BP Percentile --      Boys Systolic BP Percentile --      Boys Diastolic BP Percentile --  Pulse Rate 08/02/24 1349 (!) 51     Resp 08/02/24 1349 16     Temp 08/02/24 1349 98.1 F (36.7 C)     Temp Source 08/02/24 1349 Oral     SpO2 08/02/24 1349 98 %     Weight 08/02/24 1347 185 lb 6.5 oz (84.1 kg)     Height 08/02/24 1347 5' 2 (1.575 m)     Head Circumference --      Peak Flow --      Pain Score 08/02/24 1347 0     Pain Loc --      Pain Education --      Exclude from Growth Chart --    No data found.  Updated Vital Signs BP (!) 202/82 (BP Location: Right Arm) Comment: she states she has not taken her BP medication today.  Pulse (!) 51   Temp 98.1 F (36.7 C) (Oral)   Resp 16   Ht 5' 2 (1.575 m)   Wt 185 lb 6.5 oz (84.1 kg)   SpO2 98%   BMI 33.91 kg/m   LAST  BPS: 07/08/24: 134/80 01/09/24: 140/80 06/27/23: 124/82   Physical Exam Vitals and nursing note reviewed.  Constitutional:      General: She is not in acute distress.    Appearance: Normal appearance. She is not ill-appearing or toxic-appearing.  HENT:     Head: Normocephalic and atraumatic.     Right Ear: Tympanic membrane and external ear normal.     Left Ear: Tympanic membrane, ear canal and external ear normal.     Ears:     Comments: Blood within the right ear canal. No foreign body noted    Nose: Nose normal.     Mouth/Throat:     Mouth: Mucous membranes are moist.     Pharynx: Oropharynx is clear.  Eyes:     General: No scleral icterus.       Right eye: No discharge.        Left eye: No discharge.     Extraocular Movements: Extraocular movements intact.     Conjunctiva/sclera: Conjunctivae normal.     Pupils: Pupils are equal, round, and reactive to light.  Cardiovascular:     Rate and Rhythm: Regular rhythm. Bradycardia present.     Heart sounds: Normal heart sounds.  Pulmonary:     Effort: Pulmonary effort is normal. No respiratory distress.     Breath sounds: Normal breath sounds.  Musculoskeletal:     Cervical back: Neck supple.  Skin:    General: Skin is dry.  Neurological:     General: No focal deficit present.     Mental Status: She is alert and oriented to person, place, and time. Mental status is at baseline.     Cranial Nerves: No cranial nerve deficit.     Motor: No weakness.     Coordination: Coordination normal.     Gait: Gait normal.     Comments: 5/5 strength bilateral upper and lower exts  Psychiatric:        Mood and Affect: Mood normal.        Behavior: Behavior normal.      UC Treatments / Results  Labs (all labs ordered are listed, but only abnormal results are displayed) Labs Reviewed - No data to display  EKG   Radiology No results found.  Procedures Procedures (including critical care time)    Medications Ordered in  UC Medications - No data to display  Initial Impression /  Assessment and Plan / UC Course  I have reviewed the triage vital signs and the nursing notes.  Pertinent labs & imaging results that were available during my care of the patient were reviewed by me and considered in my medical decision making (see chart for details).   89 year old female with history of hypertension presents with family for suspected foreign body of the right ear canal.  She states a piece of her hearing aid got stuck in her ear.  She has used suction to try to remove it but feels that it still in there.  Blood pressure is high today at 202/82.  She takes atenolol and lisinopril HCTZ.  However, she reports not taking the medication today because she was flustered about the foreign body in her ear.  She says her blood pressure is usually normal when she is on the medications.  She does report a mild to moderate headache without any other associated symptoms.  Has not taken anything for the headache.  States these type of tension headaches are typical for her.  Not the worst headache of her life.  Recheck of BP is 172/88  On exam she is alert and oriented x 3.  Normal neuroexam.  Full strength upper and lower extremities.  Has bleeding within the right ear canal without evidence of foreign body and no damage to the TM or infection of TM noted.  Chest clear.  Heart regular rate rhythm.  Patient was advised there is no foreign body in the ear canal now. She must have removed it.  Minor injury to the ear canal.  Monitor.  If drainage or ear pain advised to return.  Advised not to place anything else in the ear.  Hypertension with headache. Advised ED for evaluation, but patient declines and agrees to sign an AMA. Discussed stroke level high blood pressure and explained that it needs to be safely brought down and she further evaluated for the headache. Explained the BP being that high could lead to a stroke which is life  threatening. Patient aware of risks and signed AMA. See above.  Continue blood pressure medication.  If consistently greater than 140 or 90 should follow with PCP about potential medication changes.  Advised to go to ED if headache worsens, increase dizziness, weakness, chest pain, shortness of breath, numbness/tingling, speech or balance problems, nausea, etc.   Final Clinical Impressions(s) / UC Diagnoses   Final diagnoses:  Otalgia of right ear  Essential hypertension  Acute nonintractable headache, unspecified headache type  Injury of right ear, initial encounter   Discharge Instructions   None    ED Prescriptions   None    PDMP not reviewed this encounter.   Arvis Jolan NOVAK, PA-C 08/02/24 1436

## 2024-08-02 NOTE — Discharge Instructions (Addendum)
-  Nothing in the ear now other than a minor injury and bleeding. Return if ear pain, drainage  - BP really high. We discussed this is stroke level high and with the headache has advised going to ER. You declined and signed an AMA form. Continue blood pressure medication.  If consistently greater than 140 or 90 should follow with PCP about potential medication changes.  Advised to go to ED if BP not coming down in a couple hours, headache worsens, increased dizziness, weakness, chest pain, shortness of breath, numbness/tingling, speech or balance problems, nausea, etc.

## 2024-08-02 NOTE — ED Triage Notes (Signed)
 Pt states she a little piece of her hearing aid in her right. She states she went to put her hearing aid in this morning and a piece came off. She states she feel that there is something in her ear but no pain.

## 2024-11-13 ENCOUNTER — Other Ambulatory Visit: Payer: Self-pay | Admitting: Internal Medicine

## 2024-11-13 DIAGNOSIS — Z1231 Encounter for screening mammogram for malignant neoplasm of breast: Secondary | ICD-10-CM

## 2024-12-21 ENCOUNTER — Ambulatory Visit
Admission: RE | Admit: 2024-12-21 | Discharge: 2024-12-21 | Disposition: A | Discharge status: Home / Self Care | Source: Ambulatory Visit | Attending: Internal Medicine | Admitting: Internal Medicine

## 2024-12-21 DIAGNOSIS — Z1231 Encounter for screening mammogram for malignant neoplasm of breast: Secondary | ICD-10-CM | POA: Insufficient documentation
